# Patient Record
Sex: Male | Born: 1970 | Hispanic: No | Marital: Married
Health system: Southern US, Community
[De-identification: ages and names within clinical notes are randomized; demographics above are authoritative.]

---

## 2008-04-21 ENCOUNTER — Encounter: Admission: RE | Admit: 2008-04-21 | Discharge: 2008-04-21 | Payer: Self-pay | Admitting: Pulmonary Disease

## 2021-03-16 ENCOUNTER — Emergency Department (HOSPITAL_COMMUNITY): Payer: Self-pay

## 2021-03-16 ENCOUNTER — Emergency Department (HOSPITAL_COMMUNITY)
Admission: EM | Admit: 2021-03-16 | Discharge: 2021-03-16 | Disposition: A | Discharge status: Home / Self Care | Payer: Self-pay | Attending: Emergency Medicine | Admitting: Emergency Medicine

## 2021-03-16 ENCOUNTER — Encounter (HOSPITAL_COMMUNITY): Payer: Self-pay

## 2021-03-16 ENCOUNTER — Other Ambulatory Visit: Payer: Self-pay

## 2021-03-16 DIAGNOSIS — R0789 Other chest pain: Secondary | ICD-10-CM | POA: Insufficient documentation

## 2021-03-16 DIAGNOSIS — D179 Benign lipomatous neoplasm, unspecified: Secondary | ICD-10-CM | POA: Insufficient documentation

## 2021-03-16 DIAGNOSIS — M549 Dorsalgia, unspecified: Secondary | ICD-10-CM | POA: Insufficient documentation

## 2021-03-16 DIAGNOSIS — Z20822 Contact with and (suspected) exposure to covid-19: Secondary | ICD-10-CM | POA: Insufficient documentation

## 2021-03-16 LAB — BASIC METABOLIC PANEL
Anion gap: 6 (ref 5–15)
BUN: 11 mg/dL (ref 6–20)
CO2: 27 mmol/L (ref 22–32)
Calcium: 8.9 mg/dL (ref 8.9–10.3)
Chloride: 104 mmol/L (ref 98–111)
Creatinine, Ser: 1.02 mg/dL (ref 0.61–1.24)
GFR, Estimated: >60 mL/min (ref >60)
Glucose, Bld: 142 mg/dL — ABNORMAL HIGH (ref 70–99)
Potassium: 4 mmol/L (ref 3.5–5.1)
Sodium: 137 mmol/L (ref 135–145)

## 2021-03-16 LAB — TROPONIN I (HIGH SENSITIVITY)
Troponin I (High Sensitivity): 3 ng/L (ref <18)
Troponin I (High Sensitivity): 3 ng/L (ref <18)

## 2021-03-16 LAB — CBC
HCT: 48 % (ref 39.0–52.0)
Hemoglobin: 16.5 g/dL (ref 13.0–17.0)
MCH: 30.3 pg (ref 26.0–34.0)
MCHC: 34.4 g/dL (ref 30.0–36.0)
MCV: 88.2 fL (ref 80.0–100.0)
Platelets: 198 10*3/uL (ref 150–400)
RBC: 5.44 MIL/uL (ref 4.22–5.81)
RDW: 12.7 % (ref 11.5–15.5)
WBC: 9.1 10*3/uL (ref 4.0–10.5)
nRBC: 0 % (ref 0.0–0.2)

## 2021-03-16 LAB — RESP PANEL BY RT-PCR (FLU A&B, COVID) ARPGX2
Influenza A by PCR: NEGATIVE
Influenza B by PCR: NEGATIVE
SARS Coronavirus 2 by RT PCR: NEGATIVE

## 2021-03-16 MED ORDER — LIDOCAINE VISCOUS HCL 2 % MT SOLN
15.0000 mL | Freq: Once | OROMUCOSAL | Status: AC
  Administered 2021-03-16: 15 mL via ORAL
  Filled 2021-03-16: qty 15

## 2021-03-16 MED ORDER — IOHEXOL 350 MG/ML SOLN
75.0000 mL | Freq: Once | INTRAVENOUS | Status: AC | PRN
  Administered 2021-03-16: 75 mL via INTRAVENOUS

## 2021-03-16 MED ORDER — PANTOPRAZOLE SODIUM 20 MG PO TBEC: 20.0000 mg | DELAYED_RELEASE_TABLET | Freq: Every day | ORAL | 0 refills | Status: AC

## 2021-03-16 MED ORDER — ALUM & MAG HYDROXIDE-SIMETH 200-200-20 MG/5ML PO SUSP
30.0000 mL | Freq: Once | ORAL | Status: AC
  Administered 2021-03-16: 30 mL via ORAL
  Filled 2021-03-16: qty 30

## 2021-03-16 NOTE — ED Provider Notes (Signed)
Emergency Medicine Provider Triage Evaluation Note  Peter Bryant , a 50 y.o. male  was evaluated in triage.  Pt complains of chest pain. States that same began 3 days ago, came on suddenly, to the right side of the chest and is intermittent in nature.  No aggravating or relieving factors, does not get worse with exertion.  Does have significant smoking history.  Denies any leg swelling.  Of note, does have what appears to be a lipoma on his back, he is concerned that that is causing his chest pain.  However, states that this lesion has been there for a long time.  Denies fevers, chills, nausea, vomiting.  Review of Systems  Positive: Chest pain, cough Negative: Shortness of breath  Physical Exam  BP 104/87 (BP Location: Right Arm)    Pulse 72    Temp 98.5 F (36.9 C) (Oral)    Resp 18    SpO2 100%  Gen:   Awake, no distress   Resp:  Normal effort  MSK:   Moves extremities without difficulty  Other:  Large lipoma located on the center of the upper back.  Medical Decision Making  Medically screening exam initiated at 2:16 PM.  Appropriate orders placed.  Peter Bryant was informed that the remainder of the evaluation will be completed by another provider, this initial triage assessment does not replace that evaluation, and the importance of remaining in the ED until their evaluation is complete.     Nestor Lewandowsky 03/16/21 1418    Fredia Sorrow, MD 03/20/21 1321

## 2021-03-16 NOTE — ED Triage Notes (Signed)
Patient complains of 3 days of intermittent chest pain, pain worse with movement and inspiration. smoker

## 2021-03-16 NOTE — Discharge Instructions (Addendum)
Start taking Protonix for possible acid reflux.  Follow-up with wellness center as you may need repeat CT scan of your chest to further evaluate for nodules in your chest.  Please refrain from smoking cigarettes.  Follow-up with Dr. Dema Severin with general surgery about mass on your back

## 2021-03-16 NOTE — ED Notes (Signed)
Patient discharge instructions reviewed with the patient. The patient verbalized understanding of instructions. Patient discharged. 

## 2021-03-16 NOTE — ED Provider Notes (Signed)
[place] EMERGENCY DEPARTMENT Provider Note   CSN: [id number] Arrival date & time: 03/16/21  1328     History Chief Complaint  Patient presents with   Chest Pain    Peter Bryant is a 50 y.o. male.  The history is provided by the patient.  Chest Pain Pain location:  Substernal area (back) Pain quality: aching   Pain radiates to:  Does not radiate Pain severity:  Mild Onset quality:  Gradual Duration:  3 days Timing:  Intermittent Progression:  Waxing and waning Chronicity:  New Context: eating (?), movement and raising an arm   Relieved by:  Nothing Worsened by:  Nothing Associated symptoms: back pain   Associated symptoms: no abdominal pain, no claudication, no cough, no dizziness, no fatigue, no fever, no headache, no heartburn, no lower extremity edema, no nausea, no numbness, no palpitations, no shortness of breath, no vomiting and no weakness   Risk factors: smoking   Risk factors: no coronary artery disease, no diabetes mellitus, no high cholesterol, no hypertension and no prior DVT/PE       History reviewed. No pertinent past medical history.  There are no problems to display for this patient.   History reviewed. No pertinent surgical history.     History reviewed. No pertinent family history.  Social History   Tobacco Use   Smoking status: Unknown    Home Medications Prior to Admission medications   Medication Sig Start Date End Date Taking? Authorizing Provider  pantoprazole (PROTONIX) 20 MG tablet Take 1 tablet (20 mg total) by mouth daily. 03/16/21 04/15/21 Yes Erron Wengert, DO    Allergies    Patient has no known allergies.  Review of Systems   Review of Systems  Constitutional:  Negative for chills, fatigue and fever.  HENT:  Negative for ear pain and sore throat.   Eyes:  Negative for pain and visual disturbance.  Respiratory:  Negative for cough and shortness of breath.   Cardiovascular:  Positive for chest pain.  Negative for palpitations and claudication.  Gastrointestinal:  Negative for abdominal pain, heartburn, nausea and vomiting.  Genitourinary:  Negative for dysuria and hematuria.  Musculoskeletal:  Positive for back pain. Negative for arthralgias.  Skin:  Negative for color change and rash.       Swelling to back   Neurological:  Negative for dizziness, seizures, syncope, weakness, numbness and headaches.  All other systems reviewed and are negative.  Physical Exam Updated Vital Signs  ED Triage Vitals  Enc Vitals Group     BP 03/16/21 1344 104/87     Pulse Rate 03/16/21 1344 72     Resp 03/16/21 1344 18     Temp 03/16/21 1344 98.5 F (36.9 C)     Temp Source 03/16/21 1344 Oral     SpO2 03/16/21 1344 100 %     Weight --      Height --      Head Circumference --      Peak Flow --      Pain Score 03/16/21 1345 6     Pain Loc --      Pain Edu? --      Excl. in [place]? --      Physical Exam Vitals and nursing note reviewed.  Constitutional:      General: He is not in acute distress.    Appearance: He is well-developed. He is not ill-appearing.  HENT:     Head: Normocephalic and atraumatic.  Eyes:  Extraocular Movements: Extraocular movements intact.     Conjunctiva/sclera: Conjunctivae normal.     Pupils: Pupils are equal, round, and reactive to light.  Cardiovascular:     Rate and Rhythm: Normal rate and regular rhythm.     Pulses:          Radial pulses are 2+ on the right side and 2+ on the left side.     Heart sounds: Normal heart sounds. No murmur heard. Pulmonary:     Effort: Pulmonary effort is normal. No respiratory distress.     Breath sounds: Normal breath sounds. No decreased breath sounds or wheezing.  Abdominal:     Palpations: Abdomen is soft.     Tenderness: There is no abdominal tenderness.  Musculoskeletal:        General: No swelling.     Cervical back: Normal range of motion and neck supple.  Skin:    General: Skin is warm and dry.      Capillary Refill: Capillary refill takes less than 2 seconds.     Findings: No erythema.     Comments: 5 x 5 cm raised lesion to the left upper back that is mildly tender but not erythematous or freely mobile  Neurological:     General: No focal deficit present.     Mental Status: He is alert.  Psychiatric:        Mood and Affect: Mood normal.    ED Results / Procedures / Treatments   Labs (all labs ordered are listed, but only abnormal results are displayed) Labs Reviewed  BASIC METABOLIC PANEL - Abnormal; Notable for the following components:      Result Value   Glucose, Bld 142 (*)    All other components within normal limits  RESP PANEL BY RT-PCR (FLU A&B, COVID) ARPGX2  CBC  TROPONIN I (HIGH SENSITIVITY)  TROPONIN I (HIGH SENSITIVITY)    EKG EKG Interpretation  Date/Time:  Saturday March 16 2021 13:47:35 EST Ventricular Rate:  75 PR Interval:  140 QRS Duration: 72 QT Interval:  374 QTC Calculation: 417 R Axis:   89 Text Interpretation: Sinus rhythm with occasional Premature ventricular complexes Otherwise normal ECG Confirmed by Lennice Sites ([id number]) on 03/16/2021 3:19:34 PM  Radiology DG Chest 2 View  Result Date: 03/16/2021 CLINICAL DATA:  Chest pain EXAM: CHEST - 2 VIEW COMPARISON:  Chest x-ray 04/21/2008 FINDINGS: Heart size is normal. Mediastinum appears stable. Chronic appearing prominent interstitial lung markings bilaterally with no focal consolidation identified. No pleural effusion or pneumothorax. IMPRESSION: Chronic appearing prominent interstitial lung markings with no focal consolidation identified. Electronically Signed   By: Ofilia Neas M.D.   On: 03/16/2021 14:50   CT Angio Chest PE W and/or Wo Contrast  Result Date: 03/16/2021 CLINICAL DATA:  Right chest pain intermittently for 3 days. Evaluate for PE. EXAM: CT ANGIOGRAPHY CHEST WITH CONTRAST TECHNIQUE: Multidetector CT imaging of the chest was performed using the standard protocol during  bolus administration of intravenous contrast. Multiplanar CT image reconstructions and MIPs were obtained to evaluate the vascular anatomy. CONTRAST:  8mL OMNIPAQUE IOHEXOL 350 MG/ML SOLN COMPARISON:  None. FINDINGS: Cardiovascular: Satisfactory opacification of the pulmonary arteries to the segmental level. No evidence of pulmonary embolism. Normal heart size. No pericardial effusion. Mediastinum/Nodes: No enlarged mediastinal, hilar, or axillary lymph nodes. Thyroid gland, trachea, and esophagus demonstrate no significant findings. Lungs/Pleura: There is mild centrilobular and paraseptal emphysema. Minimal opacities at the left costophrenic angle likely atelectasis or scarring. No pneumothorax or pleural effusion.  There are several small nodules along the trachea which are low density and may represent mucous. Upper Abdomen: No acute abnormality. Musculoskeletal: No chest wall abnormality. No acute or significant osseous findings. Review of the MIP images confirms the above findings. IMPRESSION: 1. No evidence of pulmonary embolism or other acute intrathoracic process. 2. Mild centrilobular and paraseptal emphysema. 3. Several small nodules along the trachea which are low density and may represent mucous. Consider short-term follow-up CT in 3-6 months as an outpatient. Emphysema (ICD10-J43.9). Electronically Signed   By: Audie Pinto M.D.   On: 03/16/2021 17:21    Procedures Procedures   Medications Ordered in ED Medications  alum & mag hydroxide-simeth (MAALOX/MYLANTA) 200-200-20 MG/5ML suspension 30 mL (30 mLs Oral Given 03/16/21 1549)    And  lidocaine (XYLOCAINE) 2 % viscous mouth solution 15 mL (15 mLs Oral Given 03/16/21 1549)  iohexol (OMNIPAQUE) 350 MG/ML injection 75 mL (75 mLs Intravenous Contrast Given 03/16/21 1653)    ED Course  I have reviewed the triage vital signs and the nursing notes.  Pertinent labs & imaging results that were available during my care of the patient were  reviewed by me and considered in my medical decision making (see chart for details).    MDM Rules/Calculators/A&P                          Peter Bryant is a 50 year old male with no significant medical history presents the ED with chest pain, back pain.  Patient with normal vitals.  No fever.  EKG shows sinus rhythm.  No ischemic changes.  Troponin has already been drawn and is within normal limits.  Will get second troponin.  Overall atypical sounding chest pain.  No cardiac risk factors except for smoking and have low suspicion for ACS.  Patient with no PE risk factors.  Clear breath sounds on exam.  Chest x-ray that shows no acute findings.  No pneumonia, no pneumothorax.  Suspect pain could be GI related or muscular related and related to what I suspect is a lipoma on his back.  We will get a CT scan to further evaluate for mass/PE/pneumonia.  We will get viral swab.  Will give GI cocktail. Heart score 2.  Repeat troponin within normal limits and doubt ACS.  COVID test and flu test negative.  CT scan shows no blood clot.  Does have evidence of emphysema.  However he does not have any wheezing or cough and have lower suspicion for COPD exacerbation.  He does have some nodules along the trachea but recommend that he follow-up outpatient for repeat CT scan in 6 months.  He does not currently have insurance and we will give him information to follow-up with the wellness center.  GI cocktail did help with his discomfort and we will start him on Protonix.  We will give him information to follow-up with general surgery to discuss lipoma.  Discharged in good condition.  This chart was dictated using voice recognition software.  Despite best efforts to proofread,  errors can occur which can change the documentation meaning.      Final Clinical Impression(s) / ED Diagnoses Final diagnoses:  Lipoma, unspecified site  Atypical chest pain    Rx / DC Orders ED Discharge Orders          Ordered     pantoprazole (PROTONIX) 20 MG tablet  Daily        03/16/21 1737  Lennice Sites, DO 03/16/21 1737

## 2023-01-04 IMAGING — CT CT ANGIO CHEST
2 of 7 series · 19 of 46 positions shown · IV contrast (APPLIED)
Comparison: None.

CLINICAL DATA: Right chest pain intermittently for 3 days. Evaluate
for PE.

EXAM:
CT ANGIOGRAPHY CHEST WITH CONTRAST
TECHNIQUE: Multidetector CT imaging of the chest was performed using the
standard protocol during bolus administration of intravenous
contrast. Multiplanar CT image reconstructions and MIPs were
obtained to evaluate the vascular anatomy.
CONTRAST:  75mL OMNIPAQUE IOHEXOL 350 MG/ML SOLN

[Series 8: thins · axial · 0.60mm/px · z∈[-264,-38]mm · 16 of 366 slices shown]
[im 21/366  lung]
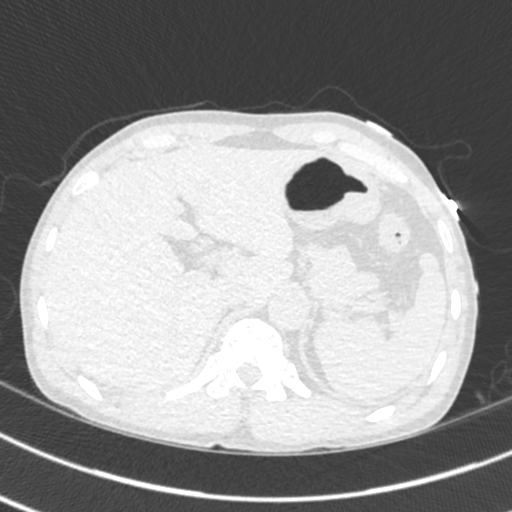
[im 41/366  soft-tissue]
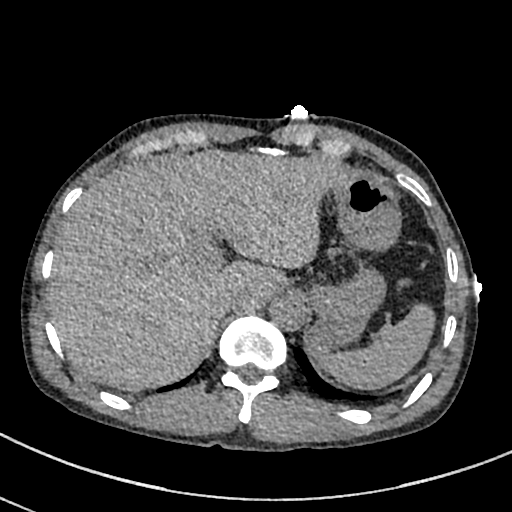
[im 61/366  lung]
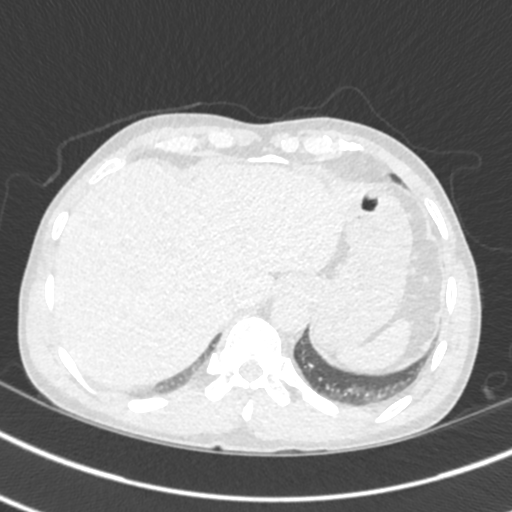
[im 82/366  soft-tissue]
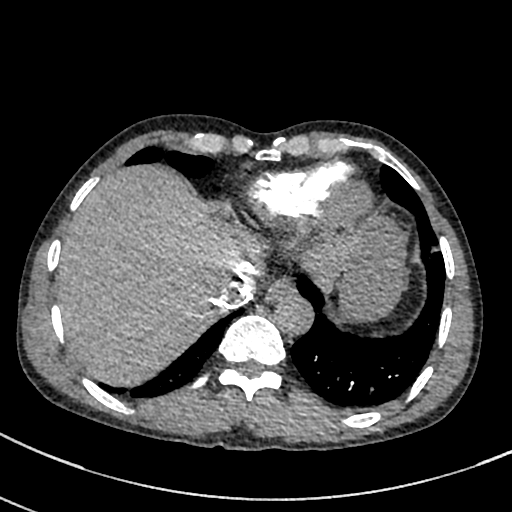
[im 102/366  lung]
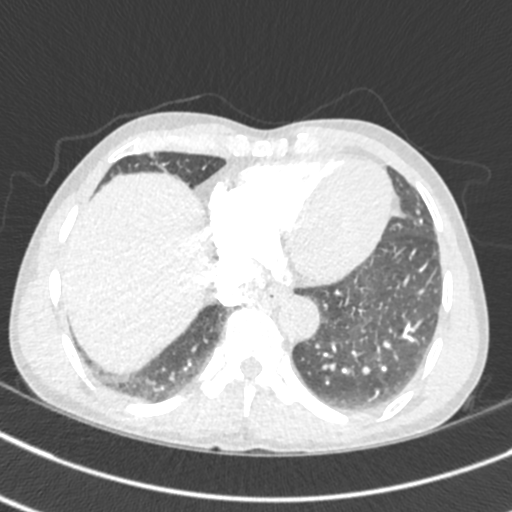
[im 122/366  soft-tissue]
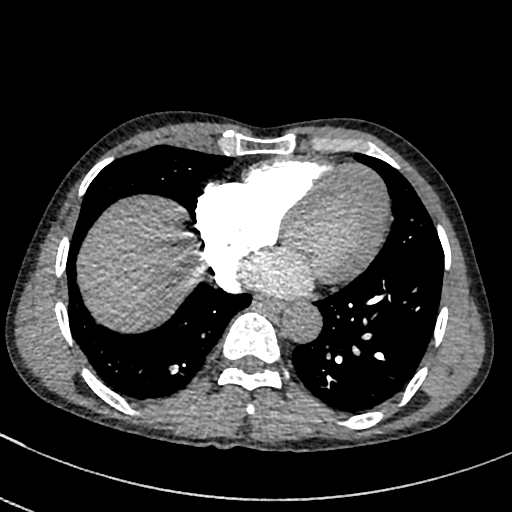
[im 142/366  lung]
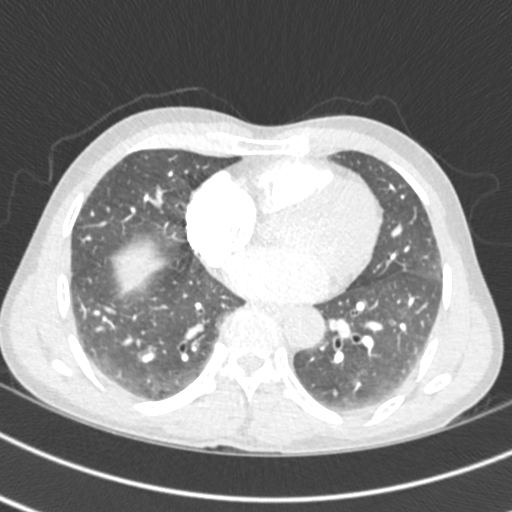
[im 163/366  soft-tissue]
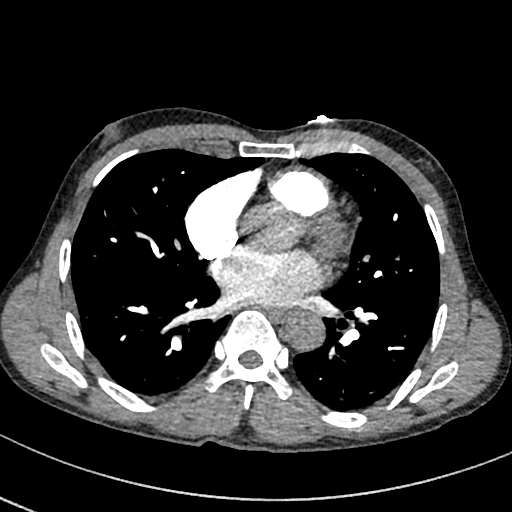
[im 203/366  lung]
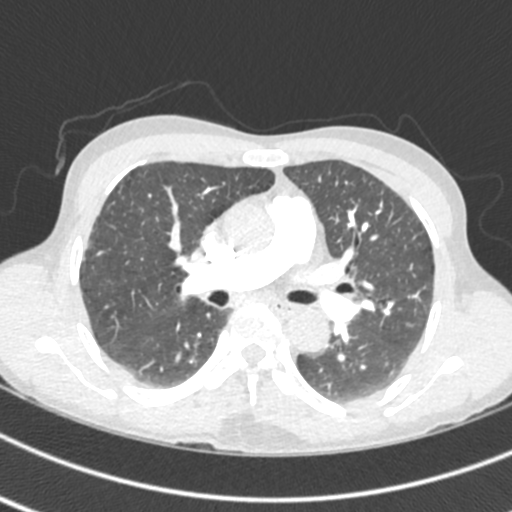
[im 224/366  soft-tissue]
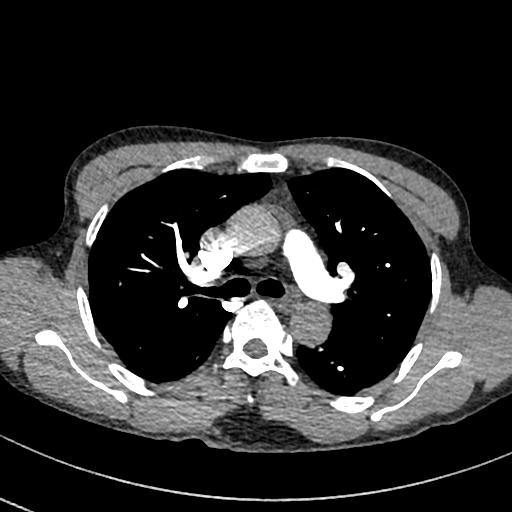
[im 244/366  lung]
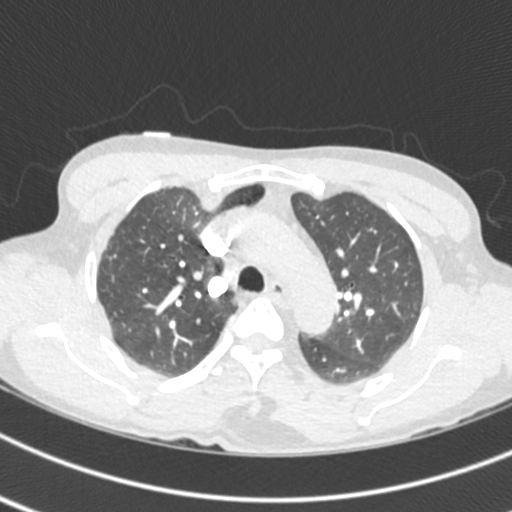
[im 264/366  soft-tissue]
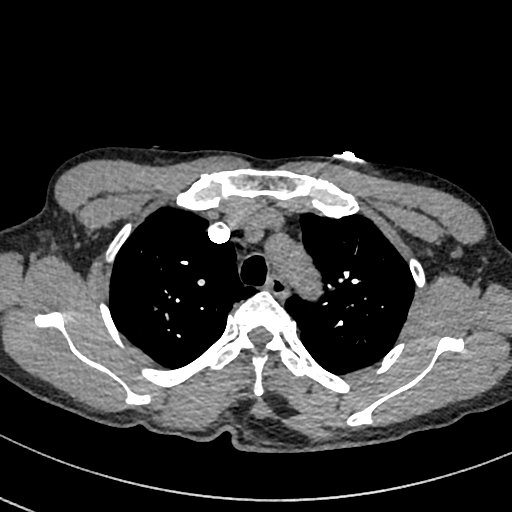
[im 284/366  lung]
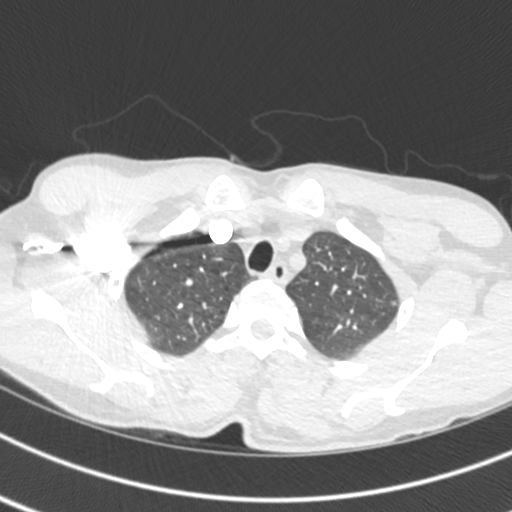
[im 305/366  soft-tissue]
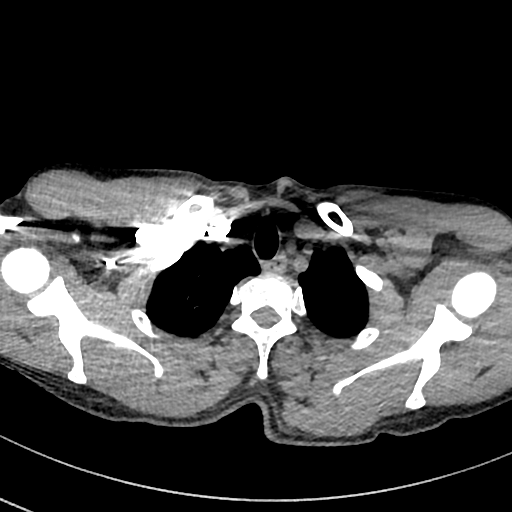
[im 325/366  lung]
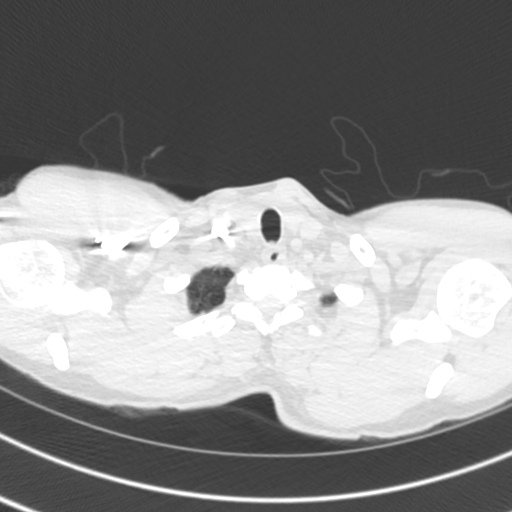
[im 345/366  soft-tissue]
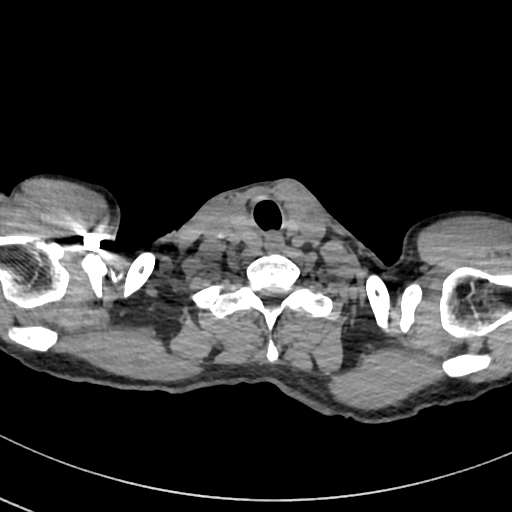

[Series 9: cor · coronal · 0.53mm/px · 3 of 101 slices shown]
[im 26/101  soft-tissue]
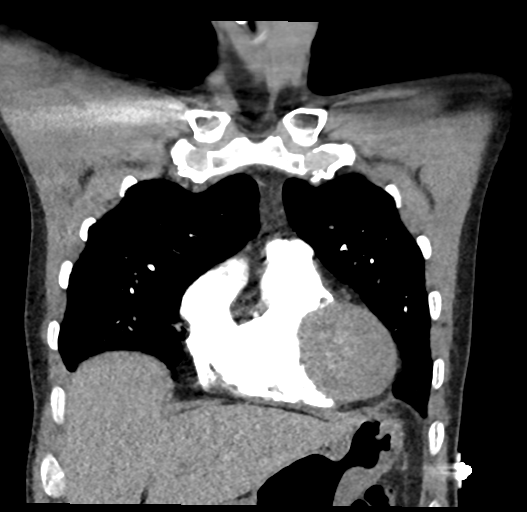
[im 51/101  soft-tissue]
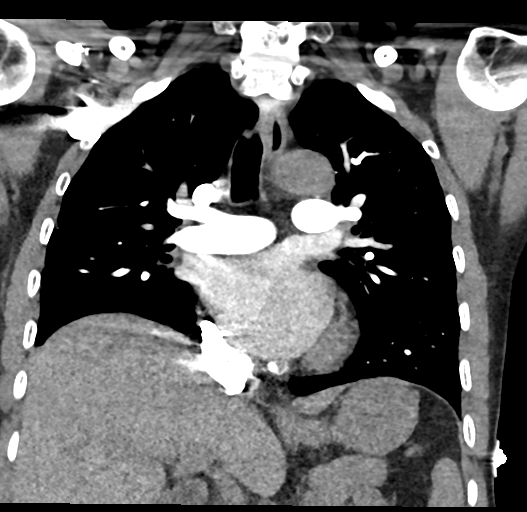
[im 76/101  soft-tissue]
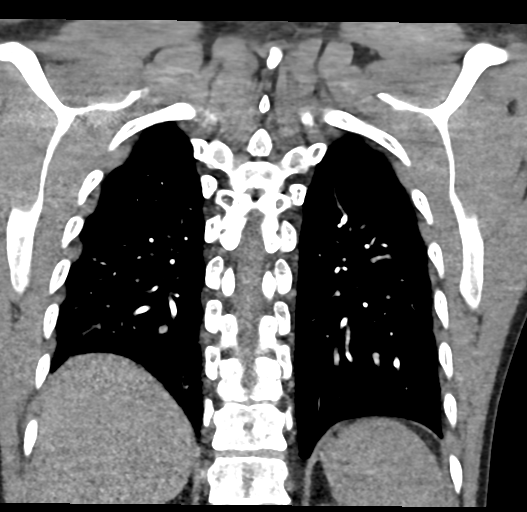

[19 of 46 positions shown; findings below may reference images not displayed]

FINDINGS: Cardiovascular: Satisfactory opacification of the pulmonary arteries
to the segmental level. No evidence of pulmonary embolism. Normal
heart size. No pericardial effusion.

Mediastinum/Nodes: No enlarged mediastinal, hilar, or axillary lymph
nodes. Thyroid gland, trachea, and esophagus demonstrate no
significant findings.

Lungs/Pleura: There is mild centrilobular and paraseptal emphysema.
Minimal opacities at the left costophrenic angle likely atelectasis
or scarring. No pneumothorax or pleural effusion.

There are several small nodules along the trachea which are low
density and may represent mucous.

Upper Abdomen: No acute abnormality.

Musculoskeletal: No chest wall abnormality. No acute or significant
osseous findings.

Review of the MIP images confirms the above findings.
IMPRESSION: 1. No evidence of pulmonary embolism or other acute intrathoracic
process.
2. Mild centrilobular and paraseptal emphysema.
3. Several small nodules along the trachea which are low density and
may represent mucous. Consider short-term follow-up CT in 3-6 months
as an outpatient.

Emphysema ([id number]).

## 2024-03-18 ENCOUNTER — Emergency Department (HOSPITAL_COMMUNITY)

## 2024-03-18 ENCOUNTER — Other Ambulatory Visit: Payer: Self-pay

## 2024-03-18 ENCOUNTER — Emergency Department (HOSPITAL_COMMUNITY)
Admission: EM | Admit: 2024-03-18 | Discharge: 2024-03-18 | Disposition: A | Discharge status: Home / Self Care | Attending: Emergency Medicine | Admitting: Emergency Medicine

## 2024-03-18 ENCOUNTER — Encounter (HOSPITAL_COMMUNITY): Payer: Self-pay

## 2024-03-18 DIAGNOSIS — M5442 Lumbago with sciatica, left side: Secondary | ICD-10-CM | POA: Diagnosis not present

## 2024-03-18 DIAGNOSIS — M545 Low back pain, unspecified: Secondary | ICD-10-CM | POA: Diagnosis present

## 2024-03-18 MED ORDER — NAPROXEN 500 MG PO TABS: 500.0000 mg | ORAL_TABLET | Freq: Two times a day (BID) | ORAL | 0 refills | Status: AC

## 2024-03-18 MED ORDER — LIDOCAINE 5 % EX PTCH: 1.0000 | MEDICATED_PATCH | CUTANEOUS | 0 refills | Status: AC

## 2024-03-18 MED ORDER — ONDANSETRON 4 MG PO TBDP
4.0000 mg | ORAL_TABLET | Freq: Once | ORAL | Status: AC
  Administered 2024-03-18: 4 mg via ORAL
  Filled 2024-03-18: qty 1

## 2024-03-18 MED ORDER — OXYCODONE-ACETAMINOPHEN 5-325 MG PO TABS
1.0000 | ORAL_TABLET | ORAL | Status: DC | PRN
  Administered 2024-03-18: 1 via ORAL
  Filled 2024-03-18: qty 1

## 2024-03-18 NOTE — ED Provider Triage Note (Signed)
 Emergency Medicine Provider Triage Evaluation Note  Nelton Boughner , a 53 y.o. male  was evaluated in triage with back pain that began yesterday afternoon. The symptoms started after he was lifting a heavy dresser and has been wax/wane since onset. The patient describes the symptoms as pain in his lower left back with some tingling that travels down his left leg.  The patient reports no bladder or bowel incontinence, numbness lower extremities, or obvious trauma.  The patient states that he has not tried anything for pain relief.  Patient is in no acute distress. Review of Systems  Positive: Back pain, neurological changes-tingling to left lower extremity Negative:   Physical Exam  BP 116/85 (BP Location: Right Arm)   Pulse 69   Temp 97.9 F (36.6 C)   Resp 18   Ht 5' 3 (1.6 m)   Wt 68 kg   SpO2 99%   BMI 26.57 kg/m  Gen:   Awake, no distress   Resp:  Normal effort  MSK:   Moves extremities without difficulty.  Left lumbar paraspinal tenderness.  No obvious trauma, step-offs, or deformities. Other:  Patient complaining of some tingling to the left lower extremity however neurological exam unremarkable.   Medical Decision Making  Medically screening exam initiated at 12:22 PM.  Appropriate orders placed.  Kapono Shough was informed that the remainder of the evaluation will be completed by another provider, this initial triage assessment does not replace that evaluation, and the importance of remaining in the ED until their evaluation is complete.    Willma Duwaine CROME, [place] 03/18/24 1225

## 2024-03-18 NOTE — Discharge Instructions (Signed)
 Lease take naproxen twice daily with food every 12 hours.  You can also take 1000 mg of Tylenol every 8 hours.  Apply Lidoderm  patch, ice and heat over area of pain.  Follow-up with neurosurgery if back pain continues.  You can also follow-up with primary care doctor for recheck of symptoms.  Return to emergency room with new or worsening symptoms like worsening severe back pain, loss of bowel or bladder function or numbness or tingling in your groin.

## 2024-03-18 NOTE — ED Triage Notes (Signed)
 Patients family states that he was walking around thew house last night when he heard a pop in his back and it started hurting immediately He is reporting that his whole lower back is hurting. He denies having any previous back surgery or injuries. He is having tingling and pain in his left leg. Denies any issues with using the restroom or having any incontinence.

## 2024-03-18 NOTE — ED Provider Notes (Cosign Needed)
 " [place] [facility]GENCY DEPART[facility] AT [facility] Provider Note   CSN: [id number] Arrival date & time: 03/18/24  1056     Patient presents with: Back Pain   Peter Bryant is a 53 y.o. male with noncontributory past medical history reporting to ER with complaint of low back pain. Patient reports that he was doing work around the house and started have low back pain.  Patient went to pick up a heavy dresser yesterday and when he sat the dresser down he started experiencing low back pain it is bilateral but worse on the left side.  He does have some pain that seems to be traveling down his left thigh.  He denies any numbness or tingling in his extremities.  He has no history of IV drug use or fever with this.  No saddle anesthesia or loss of bowel or bladder.  No history of cancer. Able to ambulate.   Patient's family member is at bedside and prefers to translate.  Interpreter service were offered.    Back Pain      Prior to Admission medications  Medication Sig Start Date End Date Taking? Authorizing Provider  pantoprazole  (PROTONIX ) 20 MG tablet Take 1 tablet (20 mg total) by mouth daily. 03/16/21 04/15/21  Ruthe Cornet, DO    Allergies: Patient has no known allergies.    Review of Systems  Musculoskeletal:  Positive for back pain.    Updated Vital Signs BP 116/85 (BP Location: Right Arm)   Pulse 69   Temp 97.9 F (36.6 C)   Resp 18   Ht 5' 3 (1.6 m)   Wt 68 kg   SpO2 99%   BMI 26.57 kg/m   Physical Exam Vitals and nursing note reviewed.  Constitutional:      General: He is not in acute distress.    Appearance: He is not toxic-appearing.  HENT:     Head: Normocephalic and atraumatic.  Eyes:     General: No scleral icterus.    Conjunctiva/sclera: Conjunctivae normal.  Cardiovascular:     Rate and Rhythm: Normal rate and regular rhythm.     Pulses: Normal pulses.     Heart sounds: Normal heart sounds.  Pulmonary:     Effort: Pulmonary effort is normal. No  respiratory distress.     Breath sounds: Normal breath sounds.  Abdominal:     General: Abdomen is flat. Bowel sounds are normal.     Palpations: Abdomen is soft.     Tenderness: There is no abdominal tenderness.  Musculoskeletal:       Arms:  Skin:    General: Skin is warm and dry.     Findings: No lesion.  Neurological:     General: No focal deficit present.     Mental Status: He is alert and oriented to person, place, and time. Mental status is at baseline.     (all labs ordered are listed, but only abnormal results are displayed) Labs Reviewed - No data to display  EKG: None  Radiology: No results found.   Procedures   Medications Ordered in the ED  oxyCODONE-acetaminophen (PERCOCET/ROXICET) 5-325 MG per tablet 1 tablet (1 tablet Oral Given 03/18/24 1132)  ondansetron (ZOFRAN-ODT) disintegrating tablet 4 mg (4 mg Oral Given 03/18/24 1228)                                    Medical Decision Making Risk Prescription  drug management.   This patient presents to the ED for concern of low back pain, this involves an extensive number of treatment options, and is a complaint that carries with it a high risk of complications and morbidity.  The differential diagnosis includes MSK in nature, fracture, epidural hematoma/abscess, cauda equina syndrome, spinal stenosis, spinal malignancy, discitis, spinal infection, spondylitises/ spondylosis, conus medullaris, DDD of the back.   Imaging Studies ordered:  I ordered imaging studies including lumbar spine x-ray  Interpretation of the imaging shows chronic appearing deformity of the anterior superior endplate of L4 as well as mild degenerative changes at L1-L2, L2-L3 and L3-L4.   Cardiac Monitoring: / EKG:  The patient was maintained on a cardiac monitor.     Problem List / ED Course / Critical interventions / Medication management  Patient presents with bilateral low back pain worse on the left side that started after  heavy lifting.  He has no midline tenderness and he does have mild reproducible tenderness to lateral of lumbar spine.  He has radiating pain down his left leg.  Vitals are stable and he is well-appearing sitting comfortable in bed right now. Rule out ddx including abscess, cauda equina syndrome, malignancy less likely given history of present illness. No red flag symptoms indication need for emergent MR at this time.   I ordered medication including Oxycodone.  Reevaluation of the patient after these medicines showed that the patient improved I have reviewed the patients home medicines and have made adjustments as needed. Patient's imaging shows no acute fracture.  He has had clinical improvement in symptoms here and no red flag symptoms.  I feel stable for discharge with outpatient follow-up.  He was given return precautions.        Final diagnoses:  Acute left-sided low back pain with left-sided sciatica    ED Discharge Orders          Ordered    lidocaine  (LIDODERM ) 5 %  Every 24 hours        03/18/24 1502    naproxen (NAPROSYN) 500 MG tablet  2 times daily        03/18/24 1502               Johnna Bollier, [place] SAILOR, PA-C 03/18/24 1508  "
